# Patient Record
Sex: Male | Born: 1995 | Race: White | Hispanic: No | Marital: Single | Smoking: Current every day smoker
Health system: Southern US, Community
[De-identification: ages and names within clinical notes are randomized; demographics above are authoritative.]

## PROBLEM LIST (undated history)

## (undated) DIAGNOSIS — F419 Anxiety disorder, unspecified: Secondary | ICD-10-CM

## (undated) DIAGNOSIS — S069X9A Unspecified intracranial injury with loss of consciousness of unspecified duration, initial encounter: Secondary | ICD-10-CM

## (undated) DIAGNOSIS — N200 Calculus of kidney: Secondary | ICD-10-CM

## (undated) DIAGNOSIS — S069XAA Unspecified intracranial injury with loss of consciousness status unknown, initial encounter: Secondary | ICD-10-CM

## (undated) HISTORY — PX: APPENDECTOMY: SHX54

## (undated) HISTORY — PX: FEMUR FRACTURE SURGERY: SHX633

---
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
## 2015-02-13 ENCOUNTER — Encounter: Payer: Self-pay | Admitting: Emergency Medicine

## 2015-02-13 ENCOUNTER — Emergency Department: Payer: Medicaid Other

## 2015-02-13 ENCOUNTER — Emergency Department
Admission: EM | Admit: 2015-02-13 | Discharge: 2015-02-13 | Disposition: A | Discharge status: Home / Self Care | Payer: Medicaid Other | Attending: Emergency Medicine | Admitting: Emergency Medicine

## 2015-02-13 DIAGNOSIS — D72829 Elevated white blood cell count, unspecified: Secondary | ICD-10-CM | POA: Diagnosis not present

## 2015-02-13 DIAGNOSIS — Z720 Tobacco use: Secondary | ICD-10-CM | POA: Insufficient documentation

## 2015-02-13 DIAGNOSIS — R109 Unspecified abdominal pain: Secondary | ICD-10-CM | POA: Insufficient documentation

## 2015-02-13 HISTORY — DX: Calculus of kidney: N20.0

## 2015-02-13 HISTORY — DX: Unspecified intracranial injury with loss of consciousness status unknown, initial encounter: S06.9XAA

## 2015-02-13 HISTORY — DX: Unspecified intracranial injury with loss of consciousness of unspecified duration, initial encounter: S06.9X9A

## 2015-02-13 LAB — URINALYSIS COMPLETE WITH MICROSCOPIC (ARMC ONLY)
BACTERIA UA: NONE SEEN
Bilirubin Urine: NEGATIVE
Glucose, UA: NEGATIVE mg/dL
HGB URINE DIPSTICK: NEGATIVE
Ketones, ur: NEGATIVE mg/dL
LEUKOCYTES UA: NEGATIVE
NITRITE: NEGATIVE
PROTEIN: NEGATIVE mg/dL
SPECIFIC GRAVITY, URINE: 1.021 (ref 1.005–1.030)
pH: 7 (ref 5.0–8.0)

## 2015-02-13 LAB — BASIC METABOLIC PANEL
Anion gap: 9 (ref 5–15)
BUN: 14 mg/dL (ref 6–20)
CALCIUM: 9.5 mg/dL (ref 8.9–10.3)
CO2: 26 mmol/L (ref 22–32)
Chloride: 105 mmol/L (ref 101–111)
Creatinine, Ser: 1.07 mg/dL (ref 0.61–1.24)
GFR calc non Af Amer: >60 mL/min (ref >60)
GLUCOSE: 104 mg/dL — AB (ref 65–99)
Potassium: 3.6 mmol/L (ref 3.5–5.1)
SODIUM: 140 mmol/L (ref 135–145)

## 2015-02-13 LAB — CBC
HEMATOCRIT: 50.8 % (ref 40.0–52.0)
Hemoglobin: 17 g/dL (ref 13.0–18.0)
MCH: 28.8 pg (ref 26.0–34.0)
MCHC: 33.4 g/dL (ref 32.0–36.0)
MCV: 86.3 fL (ref 80.0–100.0)
Platelets: 286 10*3/uL (ref 150–440)
RBC: 5.89 MIL/uL (ref 4.40–5.90)
RDW: 13.5 % (ref 11.5–14.5)
WBC: 16.4 10*3/uL — AB (ref 3.8–10.6)

## 2015-02-13 MED ORDER — ONDANSETRON HCL 4 MG/2ML IJ SOLN
4.0000 mg | Freq: Once | INTRAMUSCULAR | Status: AC
  Administered 2015-02-13: 4 mg via INTRAVENOUS
  Filled 2015-02-13: qty 2

## 2015-02-13 MED ORDER — SODIUM CHLORIDE 0.9 % IV BOLUS (SEPSIS)
1000.0000 mL | Freq: Once | INTRAVENOUS | Status: AC
  Administered 2015-02-13: 1000 mL via INTRAVENOUS

## 2015-02-13 MED ORDER — MORPHINE SULFATE (PF) 2 MG/ML IV SOLN
2.0000 mg | Freq: Once | INTRAVENOUS | Status: AC
  Administered 2015-02-13: 2 mg via INTRAVENOUS
  Filled 2015-02-13: qty 1

## 2015-02-13 MED ORDER — CYCLOBENZAPRINE HCL 10 MG PO TABS: 10.0000 mg | ORAL_TABLET | Freq: Three times a day (TID) | ORAL | Status: AC | PRN

## 2015-02-13 NOTE — ED Notes (Signed)
Patient ambulatory to triage with steady gait, without difficulty or distress noted; pt reports left flank pain since 10pm; st hx kidney stones

## 2015-02-13 NOTE — Discharge Instructions (Signed)
Flank Pain Flank pain refers to pain that is located on the side of the body between the upper abdomen and the back. The pain may occur over a short period of time (acute) or may be long-term or reoccurring (chronic). It may be mild or severe. Flank pain can be caused by many things. CAUSES  Some of the more common causes of flank pain include:  Muscle strains.   Muscle spasms.   A disease of your spine (vertebral disk disease).   A lung infection (pneumonia).   Fluid around your lungs (pulmonary edema).   A kidney infection.   Kidney stones.   A very painful skin rash caused by the chickenpox virus (shingles).   Gallbladder disease.  HOME CARE INSTRUCTIONS  Home care will depend on the cause of your pain. In general,  Rest as directed by your caregiver.  Drink enough fluids to keep your urine clear or pale yellow.  Only take over-the-counter or prescription medicines as directed by your caregiver. Some medicines may help relieve the pain.  Tell your caregiver about any changes in your pain.  Follow up with your caregiver as directed. SEEK IMMEDIATE MEDICAL CARE IF:   Your pain is not controlled with medicine.   You have new or worsening symptoms.  Your pain increases.   You have abdominal pain.   You have shortness of breath.   You have persistent nausea or vomiting.   You have swelling in your abdomen.   You feel faint or pass out.   You have blood in your urine.  You have a fever or persistent symptoms for more than 2-3 days.  You have a fever and your symptoms suddenly get worse. MAKE SURE YOU:   Understand these instructions.  Will watch your condition.  Will get help right away if you are not doing well or get worse. Document Released: 07/08/2005 Document Revised: 02/09/2012 Document Reviewed: 12/30/2011 [facility] Patient Information 2015 [place], [place]. This information is not intended to replace advice given to you by your  health care provider. Make sure you discuss any questions you have with your health care provider.  Leukocytosis Leukocytosis means you have more white blood cells than normal. White blood cells are made in your bone marrow. The main job of white blood cells is to fight infection. Having too many white blood cells is a common condition. It can develop as a result of many types of medical problems. CAUSES  In some cases, your bone marrow may be normal, but it is still making too many white blood cells. This could be the result of:  Infection.  Injury.  Physical stress.  Emotional stress.  Surgery.  Allergic reactions.  Tumors that do not start in the blood or bone marrow.  An inherited disease.  Certain medicines.  Pregnancy and labor. In other cases, you may have a bone marrow disorder that is causing your body to make too many white blood cells. Bone marrow disorders include:  Leukemia. This is a type of blood cancer.  Myeloproliferative disorders. These disorders cause blood cells to grow abnormally. SYMPTOMS  Some people have no symptoms. Others have symptoms due to the medical problem that is causing their leukocytosis. These symptoms may include:  Bleeding.  Bruising.  Fever.  Night sweats.  Repeated infections.  Weakness.  Weight loss. DIAGNOSIS  Leukocytosis is often found during blood tests that are done as part of a normal physical exam. Your caregiver will probably order other tests to help determine  why you have too many white blood cells. These tests may include:  A complete blood count (CBC). This test measures all the types of blood cells in your body.  Chest X-rays, urine tests (urinalysis), or other tests to look for signs of infection.  Bone marrow aspiration. For this test, a needle is put into your bone. Cells from the bone marrow are removed through the needle. The cells are then examined under a microscope. TREATMENT  Treatment is usually  not needed for leukocytosis. However, if a disorder is causing your leukocytosis, it will need to be treated. Treatment may include:  Antibiotic medicines if you have a bacterial infection.  Bone marrow transplant. Your diseased bone marrow is replaced with healthy cells that will grow new bone marrow.  Chemotherapy. This is the use of drugs to kill cancer cells. HOME CARE INSTRUCTIONS  Only take over-the-counter or prescription medicines as directed by your caregiver.  Maintain a healthy weight. Ask your caregiver what weight is best for you.  Eat foods that are low in saturated fats and high in fiber. Eat plenty of fruits and vegetables.  Drink enough fluids to keep your urine clear or pale yellow.  Get 30 minutes of exercise at least 5 times a week. Check with your caregiver before starting a new exercise routine.  Limit caffeine and alcohol.  Do not smoke.  Keep all follow-up appointments as directed by your caregiver. SEEK MEDICAL CARE IF:  You feel weak or more tired than usual.  You develop chills, a cough, or nasal congestion.  You lose weight without trying.  You have night sweats.  You bruise easily. SEEK IMMEDIATE MEDICAL CARE IF:  You bleed more than normal.  You have chest pain.  You have trouble breathing.  You have a fever.  You have uncontrolled nausea or vomiting.  You feel dizzy or lightheaded. MAKE SURE YOU:  Understand these instructions.  Will watch your condition.  Will get help right away if you are not doing well or get worse. Document Released: 05/06/2011 Document Revised: 08/09/2011 Document Reviewed: 05/06/2011 [facility] Patient Information 2015 [place], [place]. This information is not intended to replace advice given to you by your health care provider. Make sure you discuss any questions you have with your health care provider.

## 2015-02-13 NOTE — ED Provider Notes (Signed)
[facility] Emergency Department Provider Note  ____________________________________________  Time seen: 2:20 AM  I have reviewed the triage vital signs and the nursing notes.   HISTORY  Chief Complaint Flank Pain     HPI Dennis Crane is a 19 y.o. male presents with acute onset of left flank pain at 10 PM last night. Patient states current pain score is 9 out of 10 and nonradiating. Patient denies any dysuria or fever. Patient admits to a history of a kidney stone March 2016 that he was able to pass on his own.     Past Medical History  Diagnosis Date  . Anxiety   . Kidney stone   . Brain injury     There are no active problems to display for this patient.   Past Surgical History  Procedure Laterality Date  . Femur fracture surgery    . Appendectomy      No current outpatient prescriptions on file.  Allergies Review of patient's allergies indicates no known allergies.  No family history on file.  Social History Social History  Substance Use Topics  . Smoking status: Current Every Day Smoker -- 0.50 packs/day  . Smokeless tobacco: None  . Alcohol Use: No    Review of Systems  Constitutional: Negative for fever. Eyes: Negative for visual changes. ENT: Negative for sore throat. Cardiovascular: Negative for chest pain. Respiratory: Negative for shortness of breath. Gastrointestinal: Negative for abdominal pain, vomiting and diarrhea. Genitourinary: Negative for dysuria. Musculoskeletal: Positive left flank pain Skin: Negative for rash. Neurological: Negative for headaches, focal weakness or numbness.   10-point ROS otherwise negative.  ____________________________________________   PHYSICAL EXAM:  VITAL SIGNS: ED Triage Vitals  Enc Vitals Group     BP 02/13/15 0101 125/86 mmHg     Pulse Rate 02/13/15 0101 87     Resp --      Temp 02/13/15 0101 98.2 F (36.8 C)     Temp Source 02/13/15 0101 Oral     SpO2 02/13/15  0101 98 %     Weight 02/13/15 0101 120 lb (54.432 kg)     Height 02/13/15 0101 5\' 10"  (1.778 m)     Head Cir --      Peak Flow --      Pain Score 02/13/15 0106 7     Pain Loc --      Pain Edu? --      Excl. in GC? --      Constitutional: Alert and oriented. Apparent discomfort Eyes: Conjunctivae are normal. PERRL. Normal extraocular movements. ENT   Head: Normocephalic and atraumatic.   Nose: No congestion/rhinnorhea.   Mouth/Throat: Mucous membranes are moist.   Neck: No stridor. Cardiovascular: Normal rate, regular rhythm. Normal and symmetric distal pulses are present in all extremities. No murmurs, rubs, or gallops. Respiratory: Normal respiratory effort without tachypnea nor retractions. Breath sounds are clear and equal bilaterally. No wheezes/rales/rhonchi. Gastrointestinal: Soft and nontender. No distention. There is no CVA tenderness. Genitourinary: deferred Musculoskeletal: Nontender with normal range of motion in all extremities. No joint effusions.  No lower extremity tenderness nor edema. Neurologic:  Normal speech and language. No gross focal neurologic deficits are appreciated. Speech is normal.  Skin:  Skin is warm, dry and intact. No rash noted. Psychiatric: Mood and affect are normal. Speech and behavior are normal. Patient exhibits appropriate insight and judgment.  ____________________________________________    LABS (pertinent positives/negatives)  Labs Reviewed  BASIC METABOLIC PANEL - Abnormal; Notable for the following:  Glucose, Bld 104 (*)    All other components within normal limits  CBC - Abnormal; Notable for the following:    WBC 16.4 (*)    All other components within normal limits  URINALYSIS COMPLETEWITH MICROSCOPIC ([facility] ONLY) - Abnormal; Notable for the following:    Color, Urine YELLOW (*)    APPearance CLEAR (*)    Squamous Epithelial / LPF 0-5 (*)    All other components within normal limits      RADIOLOGY  CT  RENAL STONE STUDY (Final result) Result time: 02/13/15 03:19:08   Final result by Rad Results In Interface (02/13/15 03:19:08)   Narrative:   CLINICAL DATA: Acute onset of left flank pain 5 hours prior. History of kidney stone.  EXAM: CT ABDOMEN AND PELVIS WITHOUT CONTRAST  TECHNIQUE: Multidetector CT imaging of the abdomen and pelvis was performed following the standard protocol without IV contrast.  COMPARISON: CT 08/28/2014  FINDINGS: The included lung bases are clear.  The kidneys are symmetric in size without stones or hydronephrosis. There is no perinephric stranding. Both ureters are decompressed without stones along the course. Previous left hydronephrosis and distal ureteric stone have resolved.  Evaluation of the remaining solid and hollow viscera is limited given lack of contrast and paucity of intra-abdominal fat. The unenhanced liver, gallbladder, spleen, adrenal glands, and pancreas are normal.  Stomach is physiologically distended. There are no dilated or thickened bowel loops. Small volume of stool throughout the colon. Colonic wall thickening. The appendix is not seen, surgically absent per report. No free air, free fluid, or intra-abdominal fluid collection.  No retroperitoneal adenopathy. Abdominal aorta is normal in caliber.  In the pelvis the bladder is minimally distended, no bladder stone. No bladder wall thickening. Prostate gland is normal.  There are no acute or suspicious osseous abnormalities. Scattered bone islands in the pelvis.  IMPRESSION: No renal stones or obstructive uropathy. No acute abnormality in the abdomen/pelvis.   Electronically Signed By: Rubye Oaks M.D. On: 02/13/2015 03:19        INITIAL IMPRESSION / ASSESSMENT AND PLAN / ED COURSE  Pertinent labs & imaging results that were available during my care of the patient were reviewed by me and considered in my medical decision making (see chart for  details).  IV morphine 2 mg and Zofran 4 mg given for analgesia and antiemetic respectively. Patient CT scan revealed no acute intra-abdominal pathology.  ____________________________________________   FINAL CLINICAL IMPRESSION(S) / ED DIAGNOSES  Final diagnoses:  Acute left flank pain      Darci Current, MD 02/13/15 [id number]

## 2015-08-15 IMAGING — CT CT ABD-PELV W/ CM
2 of 4 series · 15 of 46 positions shown, 17 images · IV contrast (omnipaque)
Comparison: Plain films of earlier today

CLINICAL DATA: Left flank pain and left lower quadrant pain.
Vomiting yesterday. Decreased appetite. Leukocytosis.

EXAM:
CT ABDOMEN AND PELVIS WITH CONTRAST
TECHNIQUE: Multidetector CT imaging of the abdomen and pelvis was performed
using the standard protocol following bolus administration of
intravenous contrast.
CONTRAST:  75 cc Omnipaque 300

[Series 2: routine abd pel with · axial · 0.62mm/px · z∈[-1278,-873]mm · 12 of 93 slices shown, 14 images]
[im 8/93  soft-tissue]
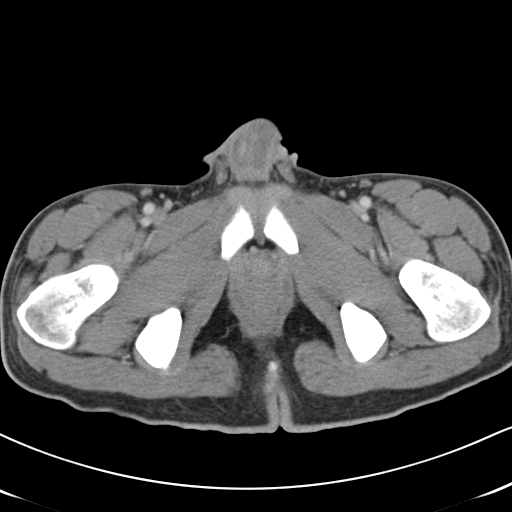
[im 8/93  bone]
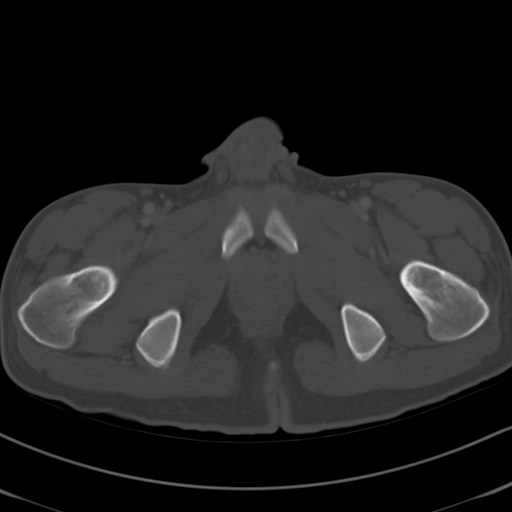
[im 15/93  soft-tissue]
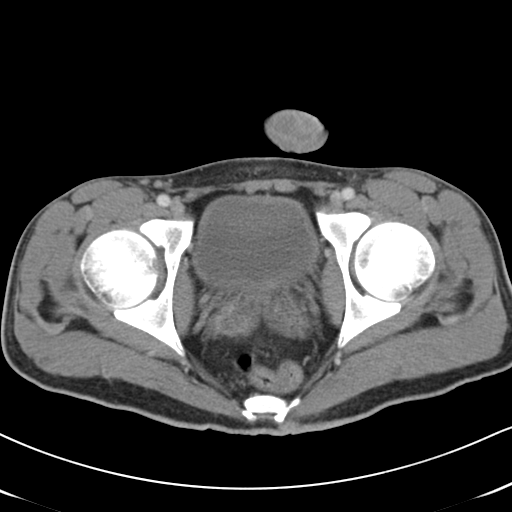
[im 23/93  soft-tissue]
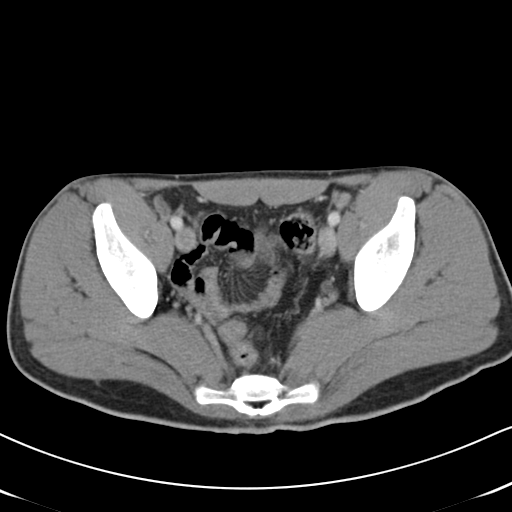
[im 30/93  soft-tissue]
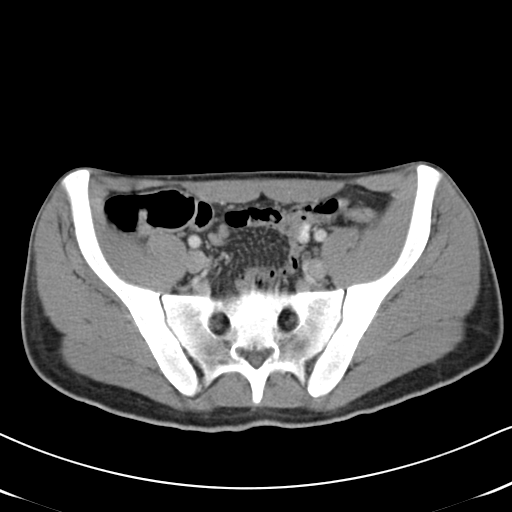
[im 37/93  soft-tissue]
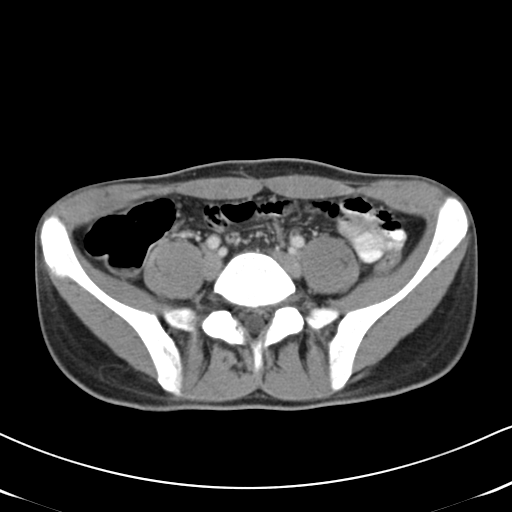
[im 45/93  soft-tissue]
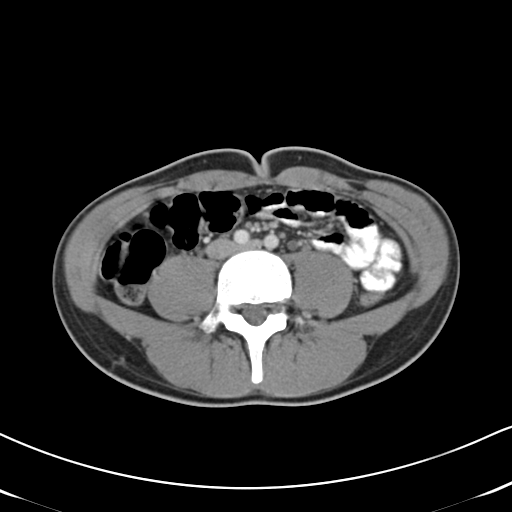
[im 52/93  soft-tissue]
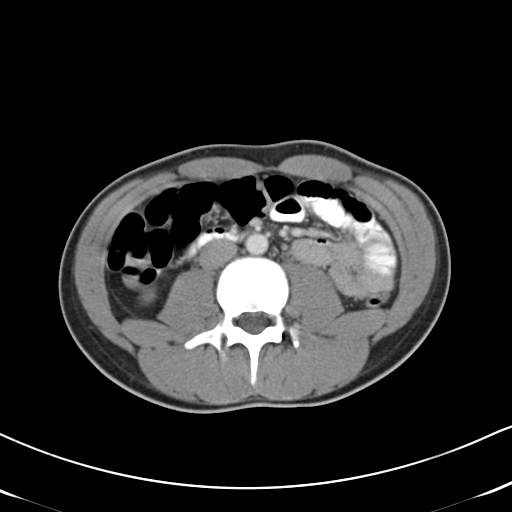
[im 59/93  soft-tissue]
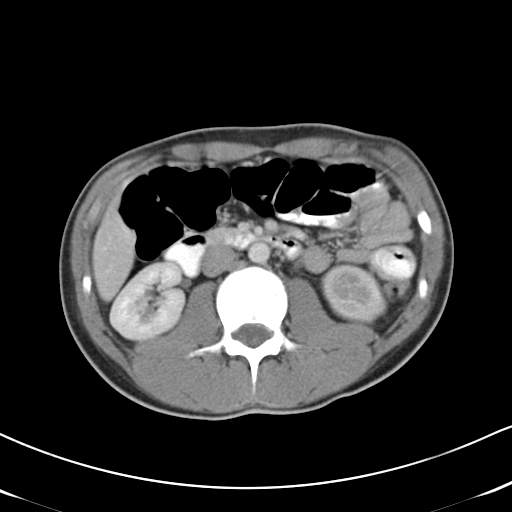
[im 67/93  soft-tissue]
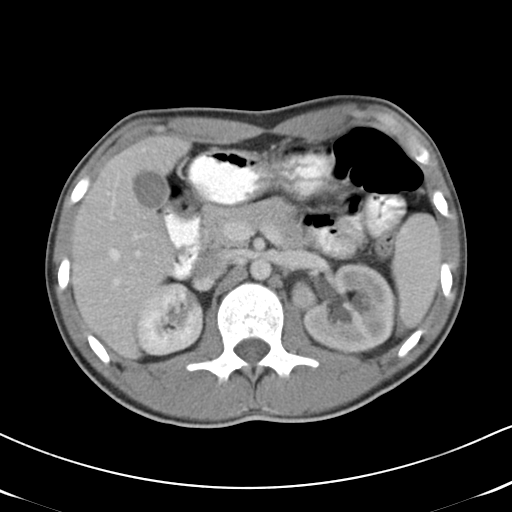
[im 67/93  bone]
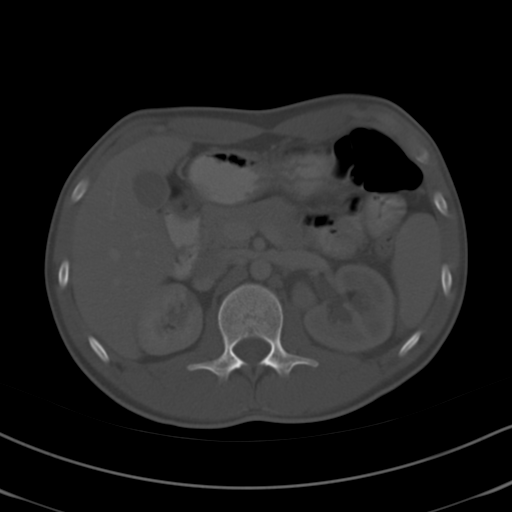
[im 74/93  soft-tissue]
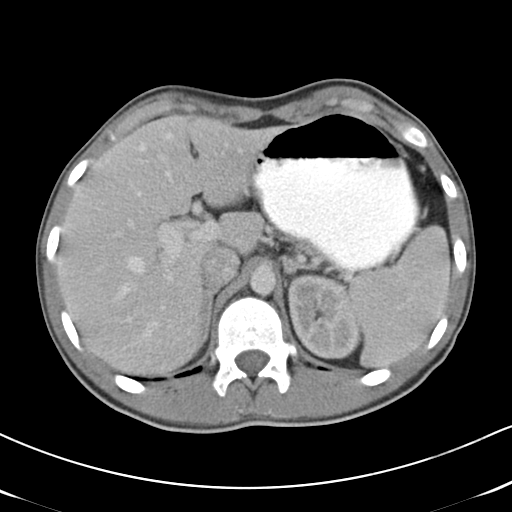
[im 81/93  soft-tissue]
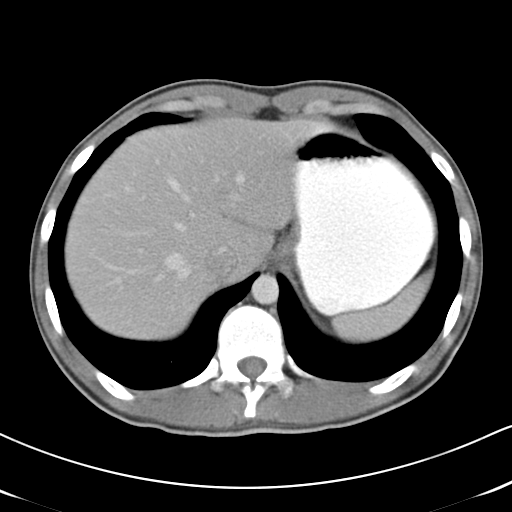
[im 89/93  soft-tissue]
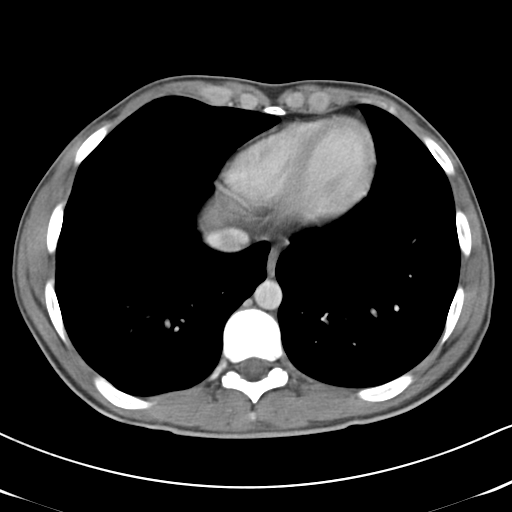

[Series 5: cor routine abd pel with · coronal · 0.54mm/px · 3 of 107 slices shown]
[im 36/107  soft-tissue]
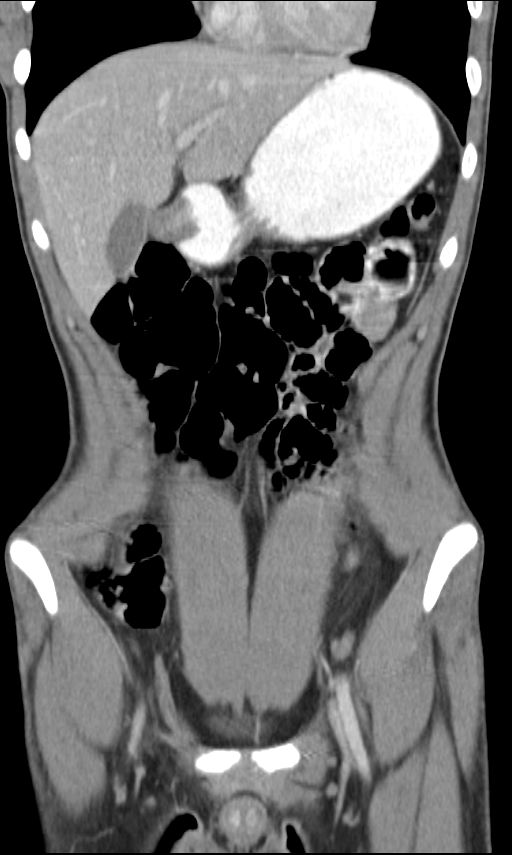
[im 48/107  soft-tissue]
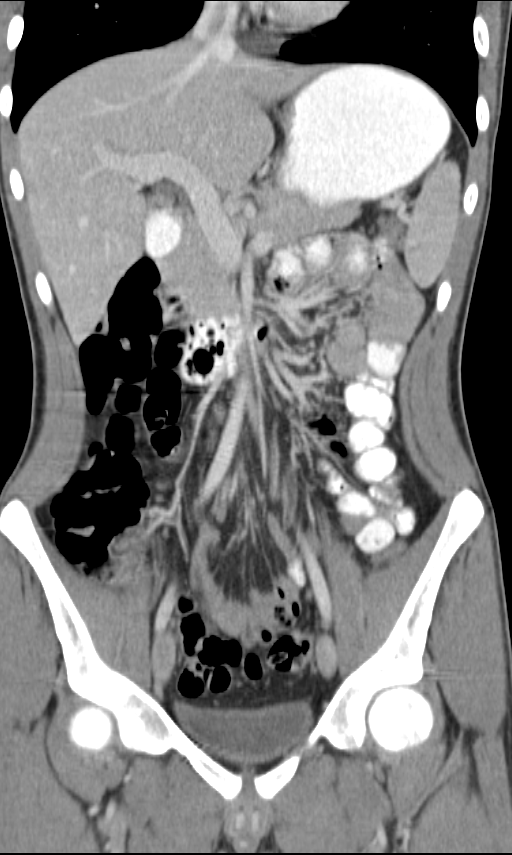
[im 59/107  soft-tissue]
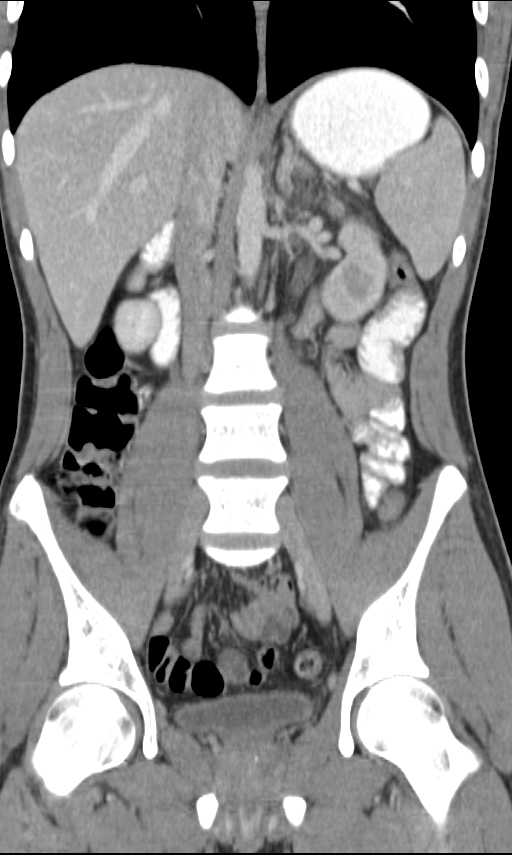

[15 of 46 positions shown; findings below may reference images not displayed]

FINDINGS: Lower chest: Clear lung bases. Normal heart size without pericardial
or pleural effusion.

Hepatobiliary: Normal liver. Normal gallbladder, without biliary
ductal dilatation.

Pancreas: Normal, without mass or ductal dilatation.

Spleen: Normal

Adrenals/Urinary Tract: Normal adrenal glands. Normal right kidney.
Mild left-sided hydroureteronephrosis with decreased renal function.
Left hydroureter continues to the level of a punctate stone either
at or just proximal to the left ureterovesicular junction.
Transverse image 77 and coronal image 66. Bladder otherwise
unremarkable.

Stomach/Bowel: Normal stomach, without wall thickening. Normal
colon, appendix, and terminal ileum. Normal small bowel.

Vascular/Lymphatic: Normal caliber of the aorta and branch vessels.
No abdominopelvic adenopathy.

Reproductive: Normal prostate.

Other: No significant free fluid.

Musculoskeletal: Intra medullary rod in the right femur,
incompletely imaged. Moderate disc bulge at the lumbosacral
junction.
IMPRESSION: Mild left-sided hydroureteronephrosis secondary to a punctate stone
either at or just proximal to the ureterovesicular junction.

## 2017-03-19 DIAGNOSIS — F1910 Other psychoactive substance abuse, uncomplicated: Secondary | ICD-10-CM | POA: Insufficient documentation

## 2017-03-19 DIAGNOSIS — R4182 Altered mental status, unspecified: Secondary | ICD-10-CM | POA: Insufficient documentation

## 2017-03-19 DIAGNOSIS — F17210 Nicotine dependence, cigarettes, uncomplicated: Secondary | ICD-10-CM | POA: Insufficient documentation

## 2017-03-19 DIAGNOSIS — F329 Major depressive disorder, single episode, unspecified: Secondary | ICD-10-CM | POA: Diagnosis present

## 2017-03-19 DIAGNOSIS — F1029 Alcohol dependence with unspecified alcohol-induced disorder: Secondary | ICD-10-CM | POA: Diagnosis not present

## 2017-03-20 ENCOUNTER — Emergency Department
Admission: EM | Admit: 2017-03-20 | Discharge: 2017-03-20 | Disposition: A | Discharge status: Home / Self Care | Payer: Medicaid Other | Attending: Emergency Medicine | Admitting: Emergency Medicine

## 2017-03-20 ENCOUNTER — Encounter: Payer: Self-pay | Admitting: Emergency Medicine

## 2017-03-20 DIAGNOSIS — F32A Depression, unspecified: Secondary | ICD-10-CM

## 2017-03-20 DIAGNOSIS — F1910 Other psychoactive substance abuse, uncomplicated: Secondary | ICD-10-CM

## 2017-03-20 DIAGNOSIS — F10929 Alcohol use, unspecified with intoxication, unspecified: Secondary | ICD-10-CM

## 2017-03-20 DIAGNOSIS — F10188 Alcohol abuse with other alcohol-induced disorder: Secondary | ICD-10-CM

## 2017-03-20 DIAGNOSIS — F329 Major depressive disorder, single episode, unspecified: Secondary | ICD-10-CM

## 2017-03-20 LAB — COMPREHENSIVE METABOLIC PANEL
ALK PHOS: 88 U/L (ref 38–126)
ALT: 27 U/L (ref 17–63)
AST: 22 U/L (ref 15–41)
Albumin: 4.2 g/dL (ref 3.5–5.0)
Anion gap: 11 (ref 5–15)
BUN: 15 mg/dL (ref 6–20)
CALCIUM: 9 mg/dL (ref 8.9–10.3)
CO2: 22 mmol/L (ref 22–32)
Chloride: 110 mmol/L (ref 101–111)
Creatinine, Ser: 1.2 mg/dL (ref 0.61–1.24)
GFR calc Af Amer: >60 mL/min (ref >60)
GFR calc non Af Amer: >60 mL/min (ref >60)
Glucose, Bld: 148 mg/dL — ABNORMAL HIGH (ref 65–99)
Potassium: 3.6 mmol/L (ref 3.5–5.1)
Sodium: 143 mmol/L (ref 135–145)
Total Bilirubin: 0.6 mg/dL (ref 0.3–1.2)
Total Protein: 6.9 g/dL (ref 6.5–8.1)

## 2017-03-20 LAB — URINE DRUG SCREEN, QUALITATIVE (ARMC ONLY)
Amphetamines, Ur Screen: NOT DETECTED
Barbiturates, Ur Screen: NOT DETECTED
Benzodiazepine, Ur Scrn: POSITIVE — AB
CANNABINOID 50 NG, UR ~~LOC~~: NOT DETECTED
COCAINE METABOLITE, UR ~~LOC~~: POSITIVE — AB
MDMA (ECSTASY) UR SCREEN: NOT DETECTED
Methadone Scn, Ur: NOT DETECTED
OPIATE, UR SCREEN: NOT DETECTED
PHENCYCLIDINE (PCP) UR S: NOT DETECTED
Tricyclic, Ur Screen: NOT DETECTED

## 2017-03-20 LAB — CBC
HCT: 44.7 % (ref 40.0–52.0)
HEMOGLOBIN: 15.5 g/dL (ref 13.0–18.0)
MCH: 30.5 pg (ref 26.0–34.0)
MCHC: 34.6 g/dL (ref 32.0–36.0)
MCV: 88.2 fL (ref 80.0–100.0)
PLATELETS: 223 10*3/uL (ref 150–440)
RBC: 5.07 MIL/uL (ref 4.40–5.90)
RDW: 13.7 % (ref 11.5–14.5)
WBC: 10.5 10*3/uL (ref 3.8–10.6)

## 2017-03-20 LAB — ETHANOL: Alcohol, Ethyl (B): 124 mg/dL — ABNORMAL HIGH (ref <10)

## 2017-03-20 MED ORDER — VITAMIN B-1 100 MG PO TABS
100.0000 mg | ORAL_TABLET | Freq: Every day | ORAL | Status: DC
  Administered 2017-03-20: 100 mg via ORAL
  Filled 2017-03-20: qty 1

## 2017-03-20 MED ORDER — FOLIC ACID 1 MG PO TABS
1.0000 mg | ORAL_TABLET | Freq: Every day | ORAL | Status: DC
  Administered 2017-03-20: 1 mg via ORAL
  Filled 2017-03-20: qty 1

## 2017-03-20 MED ORDER — NICOTINE POLACRILEX 2 MG MT GUM
2.0000 mg | CHEWING_GUM | OROMUCOSAL | Status: DC | PRN
  Administered 2017-03-20: 2 mg via ORAL
  Filled 2017-03-20: qty 1

## 2017-03-20 NOTE — ED Notes (Signed)
Pt denies SI/HI/AVH. Pt given discharge instructions. Pt states understanding. Pt states receipt of all belongings.   

## 2017-03-20 NOTE — ED Notes (Signed)
Pt mother reports that pt became aggressive this afternoon and was punching walls.

## 2017-03-20 NOTE — BH Assessment (Signed)
Assessment Note  Dennis Crane is an 21 y.o. male. The pt came in after being brought in by his mother, so he can get mental health help.  The patient found his uncle deceased after his uncle hung himself.  The pt is having flashbacks of seeing his deceased uncle and occasionally hearing his uncle's voice.  He found his deceased uncle 3 weeks ago.  He currently lives in his uncles house with his sister.  The patient denies any suicidal thoughts.  The patient reported he drinks about 5 bootleggers a day and has been drinking daily for about a year.  He requested to go to a detox facility.  He denies ever going to detox in the past.  He also uses about 40 dollars worth of cocaine and uses xanax about once a week.  When admitted the pt had a BAC level of 124 and his UDS was positive for cocaine and benzodiazapines.    According to a report from the patient's mother he was punching walls earlier.  The pt denies doing this.  The pt denies HI and psychosis.  Diagnosis: Alcohol Use Disorder, Severe  Past Medical History:  Past Medical History:  Diagnosis Date  . Anxiety   . Brain injury (HCC)   . Kidney stone     Past Surgical History:  Procedure Laterality Date  . APPENDECTOMY    . FEMUR FRACTURE SURGERY      Family History: No family history on file.  Social History:  reports that he has been smoking.  He has been smoking about 0.50 packs per day. He has never used smokeless tobacco. He reports that he drinks alcohol. He reports that he uses drugs, including Marijuana and Cocaine.  Additional Social History:  Alcohol / Drug Use Pain Medications: See PTA Prescriptions: See PTA Over the Counter: See PTA History of alcohol / drug use?: Yes Longest period of sobriety (when/how long): "a few days" Negative Consequences of Use: Legal Substance #1 Name of Substance 1: alcohol 1 - Age of First Use: 14 1 - Amount (size/oz): 6 drinks a day 1 - Frequency: daily 1 - Duration: one year 1 -  Last Use / Amount: 12 AM Substance #2 Name of Substance 2: Crack cocaine 2 - Amount (size/oz): 40 dollars worth 2 - Frequency: once a week 2 - Last Use / Amount: 03/18/2017 Substance #3 Name of Substance 3: Xanax 3 - Amount (size/oz): "one pill" 3 - Frequency: once a week 3 - Last Use / Amount: 04/19/2017  CIWA: CIWA-Ar BP: 126/82 Pulse Rate: 96 COWS:    Allergies: No Known Allergies  Home Medications:  (Not in a hospital admission)  OB/GYN Status:  No LMP for male patient.  General Assessment Data Location of Assessment: [facility] ED TTS Assessment: In system Is this a Tele or Face-to-Face Assessment?: Face-to-Face Is this an Initial Assessment or a Re-assessment for this encounter?: Initial Assessment Marital status: Single Maiden name: NA Living Arrangements: Other relatives (sister and niece) Can pt return to current living arrangement?: Yes Admission Status: Voluntary Is patient capable of signing voluntary admission?: Yes Referral Source: Self/Family/Friend Insurance type: None     Crisis Care Plan Living Arrangements: Other relatives (sister and niece) Legal Guardian: Other: (Self) Name of Psychiatrist: [facility] Name of Therapist: [employer] [facility]  Education Status Is patient currently in school?: No Current Grade: NA Highest grade of school patient has completed: 12th Name of school: NA Contact person: NA  Risk to self with  the past 6 months Suicidal Ideation: No Has patient been a risk to self within the past 6 months prior to admission? : No Suicidal Intent: No Has patient had any suicidal intent within the past 6 months prior to admission? : No Is patient at risk for suicide?: No Suicidal Plan?: No Has patient had any suicidal plan within the past 6 months prior to admission? : No Access to Means: No What has been your use of drugs/alcohol within the last 12 months?: daily alcohol use and weekly cocaine and xanax use. Previous  Attempts/Gestures: No How many times?: 0 Other Self Harm Risks: none Triggers for Past Attempts: None known Intentional Self Injurious Behavior: None Family Suicide History: Yes (Uncle commited suicide three weeks ago.) Recent stressful life event(s): Loss (Comment), Trauma (Comment) (found uncle after his uncle hung himself) Persecutory voices/beliefs?: No Depression: Yes Depression Symptoms: Loss of interest in usual pleasures, Feeling angry/irritable Substance abuse history and/or treatment for substance abuse?: Yes Suicide prevention information given to non-admitted patients: Not applicable  Risk to Others within the past 6 months Homicidal Ideation: No Does patient have any lifetime risk of violence toward others beyond the six months prior to admission? : No Thoughts of Harm to Others: No Current Homicidal Intent: No Current Homicidal Plan: No Access to Homicidal Means: No Identified Victim: none History of harm to others?: No Assessment of Violence: None Noted Violent Behavior Description: punching walls per the patient's mother.  The patient denies Does patient have access to weapons?: No Criminal Charges Pending?: No Does patient have a court date: No Is patient on probation?: No  Psychosis Hallucinations: None noted Delusions: None noted  Mental Status Report Appearance/Hygiene: In scrubs, Unremarkable Eye Contact: Fair Motor Activity: Unremarkable, Freedom of movement Speech: Logical/coherent, Slurred Level of Consciousness: Drowsy Mood: Pleasant Affect: Appropriate to circumstance Anxiety Level: Minimal Thought Processes: Relevant, Coherent Judgement: Partial Orientation: Person, Place, Time, Situation, Appropriate for developmental age Obsessive Compulsive Thoughts/Behaviors: Minimal  Cognitive Functioning Concentration: Decreased Memory: Recent Intact, Remote Intact IQ: Average Insight: Fair Impulse Control: Fair Appetite: Good Weight Loss:  0 Weight Gain: 0 Sleep: No Change Total Hours of Sleep: 8 Vegetative Symptoms: None  ADLScreening [facility] Assessment Services) Patient's cognitive ability adequate to safely complete daily activities?: Yes Patient able to express need for assistance with ADLs?: Yes Independently performs ADLs?: Yes (appropriate for developmental age)  Prior Inpatient Therapy Prior Inpatient Therapy: No Prior Therapy Dates: NA Prior Therapy Facilty/Provider(s): NA Reason for Treatment: NA  Prior Outpatient Therapy Prior Outpatient Therapy: Yes Prior Therapy Dates: current Prior Therapy Facilty/Provider(s): [facility] Reason for Treatment: finding his uncle after his uncle hung himself Does patient have an ACCT team?: No Does patient have Intensive In-House Services?  : No Does patient have [facility] services? : No Does patient have P4CC services?: No  ADL Screening (condition at time of admission) Patient's cognitive ability adequate to safely complete daily activities?: Yes Is the patient deaf or have difficulty hearing?: No Does the patient have difficulty seeing, even when wearing glasses/contacts?: No Does the patient have difficulty concentrating, remembering, or making decisions?: No Patient able to express need for assistance with ADLs?: Yes Does the patient have difficulty dressing or bathing?: No Independently performs ADLs?: Yes (appropriate for developmental age) Does the patient have difficulty walking or climbing stairs?: No Weakness of Legs: None Weakness of Arms/Hands: None  Home Assistive Devices/Equipment Home Assistive Devices/Equipment: None  Therapy Consults (therapy consults require a physician order) PT Evaluation Needed: No OT  Evalulation Needed: No SLP Evaluation Needed: No Abuse/Neglect Assessment (Assessment to be complete while patient is alone) Physical Abuse: Denies Verbal Abuse: Denies Sexual Abuse: Denies Exploitation of patient/patient's resources:  Denies Self-Neglect: Denies Values / Beliefs Cultural Requests During Hospitalization: None Spiritual Requests During Hospitalization: None Consults Spiritual Care Consult Needed: No Social Work Consult Needed: No Merchant navy officerAdvance Directives (For Healthcare) Does Patient Have a Medical Advance Directive?: No    Additional Information 1:1 In Past 12 Months?: No CIRT Risk: No Elopement Risk: No Does patient have medical clearance?: Yes     Disposition:  Disposition Initial Assessment Completed for this Encounter: Yes Disposition of Patient: Other dispositions Other disposition(s): Other (Comment) (SOC)  On Site Evaluation by:   Reviewed with Physician:    Ottis StainGarvin, Mickal Meno Jermaine 03/20/2017 3:41 AM

## 2017-03-20 NOTE — ED Notes (Signed)
Patient is requesting d/c. He has continued to deny SI/HI/AVH and is willing to seek outpatient treatment.  Report to EDP. Report to TTS.  States his mother is willing to pick him up.  This Clinical research associatewriter spoke to mother and she is supportive to this decision.

## 2017-03-20 NOTE — ED Notes (Signed)
Mother of pt in to visit.  Referred to [facility] and [facility].  Provided IOP resources for pt following d/c from inpatient treatment.  Explained POC.  Receptive to suggestions. Support offered.

## 2017-03-20 NOTE — ED Triage Notes (Signed)
Patient brought in by mother. Patient recently found his uncle after he hung himself. Patient has been struggling with depression. Patient denies SI at this times. Patient also states that he needs detox from alcohol and cocaine.

## 2017-03-20 NOTE — Discharge Instructions (Signed)

## 2017-03-20 NOTE — ED Notes (Signed)
Report was received from April B., RN; Pt. Verbalizes  complaints of having Depression; with increased alcohol and cocaine use; distress of H/O TBI; and of having found his uncle; who committed suicide from hanging several weeks ago; denies S.I./Hi. Continue to monitor with 15 min. Monitoring.

## 2017-03-20 NOTE — ED Notes (Signed)
TTS assessment in progress. 

## 2017-03-20 NOTE — ED Notes (Signed)

## 2017-03-20 NOTE — BH Assessment (Signed)
Writer spoke with [facility] Rounding Physician (Dr. Vilma PraderIsbell) about patient for possible admissin to the unit. Writer was advised to seek substance abuse treatment for patient due to not meeting psychiatric inpatient criteria.  SOC recommended "admission to inpatient substance program." Patient also denies SI/HI and AV/H.  Writer spoke with patient, about not being able to be [facility]. Discussed with patient about other options for non-insured. Patient reports of having Medicaid. Writer spoke with Patient Access and confirmed patient have Family Planning Medicaid. Spoke with patient again about other inpatient options  ([facility], [facility], [facility] and [facility]).  Writer provided the patient with information for the different facilities to help him make an informed decision on which place he would have writer to seek placement with.  Writer also informed the patient, he can not forward his information without his written permission (signed Release of Information) and provided him with the forms. Patient stated he needed a moment to think about it and he would Pharmacist, hospitallet writer know.  Writer [facility] for [facility] for "300 hall" but unable to accommodate him at this time. Writer called to inform patient's nurse Almira Coaster([place]) about [facility]. Nurse informed writer patient no longer want inpatient and want to discharge home.

## 2017-03-20 NOTE — ED Provider Notes (Signed)
Patient has not been able to be admitted for substance abuse testing due to not having a coexisting psychiatric diagnosis.  Patient is requesting to be discharged, he is awake and alert, B[facility] nurses, psychiatry, and ED physician note indicate the patient has not endorsed any suicidal ideation.  He is agreeable to outpatient substance abuse follow-up, will be given referral information by our TTS service.  Discharge instructions provided, patient's mother will be taking him home   Sharyn CreamerQuale, Mikalyn Hermida, MD 03/20/17 1840

## 2017-03-20 NOTE — ED Provider Notes (Signed)
[facility] Emergency Department Provider Note  ____________________________________________   First MD Initiated Contact with Patient 03/20/17 0117     (approximate)  I have reviewed the triage vital signs and the nursing notes.   HISTORY  Chief Complaint Psychiatric Evaluation  Level 5 caveat:  history/ROS may be limited by active psychosis / mental illness / altered mental status   HPI KEYSHAWN Crane is a 21 y.o. male with medical history as listed below who presents by private vehicle voluntarily for evaluation of worsening depression.  He also has been using alcohol and cocaine but with a history of traumatic brain injury.  Within the last couple of weeks he found his uncle hanging from a suicide and he has had difficulty dealing with that traumatic event.  He denies any suicidal ideation but is worried about the severe depression that has been gradually getting worse.  Nothing makes the symptoms better nor worse.  He also endorses creased use of drugs and alcohol since the event.  He denies chest pain, shortness of breath, nausea, vomiting, and abdominal pain.  Past Medical History:  Diagnosis Date  . Anxiety   . Brain injury (HCC)   . Kidney stone     There are no active problems to display for this patient.   Past Surgical History:  Procedure Laterality Date  . APPENDECTOMY    . FEMUR FRACTURE SURGERY      Prior to Admission medications   Not on File    Allergies Patient has no known allergies.  No family history on file.  Social History Social History  Substance Use Topics  . Smoking status: Current Every Day Smoker    Packs/day: 0.50  . Smokeless tobacco: Never Used  . Alcohol use Yes    Review of Systems Constitutional: No fever/chills Eyes: No visual changes. ENT: No sore throat. Cardiovascular: Denies chest pain. Respiratory: Denies shortness of breath. Gastrointestinal: No abdominal pain.  No nausea, no vomiting.  No  diarrhea.  No constipation. Genitourinary: Negative for dysuria. Musculoskeletal: Negative for neck pain.  Negative for back pain. Integumentary: Negative for rash. Neurological: Negative for headaches, focal weakness or numbness. Psych: Worsening depression and drug use, denies suicidal ideation  ____________________________________________   PHYSICAL EXAM:  VITAL SIGNS: ED Triage Vitals [03/20/17 0000]  Enc Vitals Group     BP (!) 150/93     Pulse Rate (!) 106     Resp 18     Temp 97.6 F (36.4 C)     Temp Source Oral     SpO2 99 %     Weight 85.3 kg (188 lb)     Height 1.753 m (5\' 9" )     Head Circumference      Peak Flow      Pain Score      Pain Loc      Pain Edu?      Excl. in GC?     Constitutional: Alert and oriented. Well appearing and in no acute distress. Eyes: Conjunctivae are normal.  Head: Atraumatic. Nose: No congestion/rhinnorhea. Mouth/Throat: Mucous membranes are moist. Neck: No stridor.  No meningeal signs.   Cardiovascular: Normal rate, regular rhythm. Good peripheral circulation. Grossly normal heart sounds. Respiratory: Normal respiratory effort.  No retractions. Lungs CTAB. Gastrointestinal: Soft and nontender. No distention.  Musculoskeletal: No lower extremity tenderness nor edema. No gross deformities of extremities. Neurologic: Slightly slurred speech and language. No gross focal neurologic deficits are appreciated.  Skin:  Skin is  warm, dry and intact. No rash noted. Psychiatric: Mood and affect are normal. Speech and behavior are normal.  Denies suicidality, denies any intention of harming anyone else  ____________________________________________   LABS (all labs ordered are listed, but only abnormal results are displayed)  Labs Reviewed  COMPREHENSIVE METABOLIC PANEL - Abnormal; Notable for the following:       Result Value   Glucose, Bld 148 (*)    All other components within normal limits  ETHANOL - Abnormal; Notable for the  following:    Alcohol, Ethyl (B) 124 (*)    All other components within normal limits  URINE DRUG SCREEN, QUALITATIVE ([facility] ONLY) - Abnormal; Notable for the following:    Cocaine Metabolite,Ur [place] POSITIVE (*)    Benzodiazepine, Ur Scrn POSITIVE (*)    All other components within normal limits  CBC   ____________________________________________  EKG  None - EKG not ordered by ED physician ____________________________________________  RADIOLOGY   No results found.  ____________________________________________   PROCEDURES  Critical Care performed: No   Procedure(s) performed:   Procedures   ____________________________________________   INITIAL IMPRESSION / ASSESSMENT AND PLAN / ED COURSE  As part of my medical decision making, I reviewed the following data within the electronic MEDICAL RECORD NUMBER Nursing notes reviewed and incorporated and Labs reviewed     The patient's lab results are generally unremarkable with an ethanol level of 124 and a drug screen positive for cocaine and benzos.  Signs are stable with only mild or borderline tachycardia.  I do not believe he is an immediate danger to himself but I do think he would benefit from speaking with the psychiatrist.  He is voluntary at this time and I have ordered a telepsych consult.  Clinical Course as of Mar 20 650  Wynelle LinkSun Mar 20, 2017  [id number] I reviewed the telepsych consult report.  They believe the patient would benefit from inpatient treatment for his substance abuse.  TTS is aware and will look into treatment options.  No medication recommendations right now but I have ordered folate and thiamine.  The patient is medically clear for admission to a detox facility when available.  [CF]  [id number] Patient remains voluntary.  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    Loleta RoseForbach, Krysti Hickling, MD 03/20/17 [phone]

## 2018-07-05 ENCOUNTER — Other Ambulatory Visit: Payer: Self-pay

## 2018-07-05 ENCOUNTER — Ambulatory Visit: Payer: Medicaid Other | Attending: Internal Medicine

## 2018-07-05 DIAGNOSIS — M5442 Lumbago with sciatica, left side: Secondary | ICD-10-CM | POA: Insufficient documentation

## 2018-07-05 DIAGNOSIS — M6281 Muscle weakness (generalized): Secondary | ICD-10-CM | POA: Diagnosis present

## 2018-07-05 DIAGNOSIS — M5441 Lumbago with sciatica, right side: Secondary | ICD-10-CM | POA: Insufficient documentation

## 2018-07-05 DIAGNOSIS — G8929 Other chronic pain: Secondary | ICD-10-CM | POA: Insufficient documentation

## 2018-07-05 DIAGNOSIS — R293 Abnormal posture: Secondary | ICD-10-CM | POA: Diagnosis present

## 2018-07-05 NOTE — Patient Instructions (Signed)
Lay on your back. Prop one leg up on a bunch of pillows. Flex and extend ankle 20x (towards toes towards wall)   Bend knees lay flat on your back. Flatten back against bed 10x 5 seconds   Sit with a towel rolled behind your back, lean back 10x
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
## 2018-07-10 ENCOUNTER — Ambulatory Visit: Payer: Medicaid Other

## 2018-07-10 DIAGNOSIS — M5442 Lumbago with sciatica, left side: Secondary | ICD-10-CM | POA: Diagnosis not present

## 2018-07-10 DIAGNOSIS — G8929 Other chronic pain: Secondary | ICD-10-CM

## 2018-07-10 DIAGNOSIS — M6281 Muscle weakness (generalized): Secondary | ICD-10-CM

## 2018-07-10 DIAGNOSIS — M5441 Lumbago with sciatica, right side: Principal | ICD-10-CM

## 2018-07-10 DIAGNOSIS — R293 Abnormal posture: Secondary | ICD-10-CM
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
## 2018-07-21 ENCOUNTER — Ambulatory Visit: Payer: Medicaid Other

## 2018-07-24 ENCOUNTER — Ambulatory Visit: Payer: Medicaid Other

## 2018-08-21 DIAGNOSIS — M5442 Lumbago with sciatica, left side: Principal | ICD-10-CM

## 2018-08-21 DIAGNOSIS — M5441 Lumbago with sciatica, right side: Principal | ICD-10-CM

## 2018-08-21 DIAGNOSIS — R293 Abnormal posture: Secondary | ICD-10-CM

## 2018-08-21 DIAGNOSIS — G8929 Other chronic pain: Secondary | ICD-10-CM

## 2018-08-21 DIAGNOSIS — M6281 Muscle weakness (generalized): Secondary | ICD-10-CM

## 2018-08-22 DIAGNOSIS — G8929 Other chronic pain: Secondary | ICD-10-CM

## 2018-08-22 DIAGNOSIS — R293 Abnormal posture: Secondary | ICD-10-CM

## 2018-08-22 DIAGNOSIS — M5442 Lumbago with sciatica, left side: Principal | ICD-10-CM

## 2018-08-22 DIAGNOSIS — M6281 Muscle weakness (generalized): Secondary | ICD-10-CM

## 2018-08-22 DIAGNOSIS — M5441 Lumbago with sciatica, right side: Principal | ICD-10-CM

## 2018-08-22 NOTE — Therapy (Unsigned)
[place] MAIN [facility] SERVICES [address] [place], [place], [address] Phone: [phone]   Fax:  [phone]  August 22, 2018   @CCLISTADDRESS @  Physical Therapy Discharge Summary  Patient: Dennis Crane  MRN: [id number]  Date of Birth: 1996-02-22   Diagnosis: Chronic bilateral low back pain with bilateral sciatica  Muscle weakness (generalized)  Abnormal posture Referring Provider (PT): Dr. Percell Belt   The above patient had been seen in Physical Therapy 2 times of 5 treatments scheduled with 1 no shows and 2 cancellations.  The treatment consisted of manual and therex for pain relief and postural stability/strengthening  The patient is: Unchanged  Subjective: Patient did not return to PT after first treatment.   Discharge Findings: Unknown due to patient not returning to physical therapy.   Functional Status at Discharge: Unknown at this time due to patient not returning to physical therapy.   No Goals Met    PT Long Term Goals - 07/05/18 1806      PT LONG TERM GOAL #1   Title  Pt will decrease worst back pain as reported on VAS to 3/10 in order to demonstrate clinically significant reduction in back pain.     Baseline  2/5: 8/10     Time  4    Period  Weeks    Status  New    Target Date  08/02/18      PT LONG TERM GOAL #2   Title  Pt will decrease mODI scoreby to 17% in order demonstrate clinically significant reduction in back pain/disability.         Baseline  2/5: 30% (mod disability)     Time  4    Period  Weeks    Status  New    Target Date  08/02/18      PT LONG TERM GOAL #3   Title  Patient will increase BLE gross strength to 4+/5 as to improve functional strength for independent gait, increased standing tolerance and increased ADL ability.    Baseline  2/5: Hip 4-/5 gross    Time  4    Period  Weeks    Status  New    Target Date  08/02/18      PT LONG TERM GOAL #4   Title  Patient will have reduction  of symptoms in LE's indicating centralization of pain and progression towards decreased pain and improved mobility.     Baseline  2/5: constant pain in bilateral LE's: 6-7/10 pain    Time  4    Period  Weeks    Status  New    Target Date  08/02/18        Sincerely,   Janna Arch, PT, DPT    CC @CCLISTRESTNAME @  [place] [address] [place], [place], [address] Phone: [phone]   Fax:  [phone]  Patient: Dennis Crane  MRN: [id number]  Date of Birth: Apr 22, 1996

## 2021-05-07 ENCOUNTER — Ambulatory Visit: Payer: Self-pay | Admitting: Urology
